# Patient Record
Sex: Female | Born: 1950 | Race: White | Hispanic: No
Health system: Southern US, Community
[De-identification: ages and names within clinical notes are randomized; demographics above are authoritative.]

---

## 1997-09-24 ENCOUNTER — Other Ambulatory Visit: Admission: RE | Admit: 1997-09-24 | Discharge: 1997-09-24 | Payer: Self-pay | Admitting: Obstetrics and Gynecology

## 1998-09-28 ENCOUNTER — Other Ambulatory Visit: Admission: RE | Admit: 1998-09-28 | Discharge: 1998-09-28 | Payer: Self-pay | Admitting: Obstetrics and Gynecology

## 2000-01-09 ENCOUNTER — Other Ambulatory Visit: Admission: RE | Admit: 2000-01-09 | Discharge: 2000-01-09 | Payer: Self-pay | Admitting: Obstetrics and Gynecology

## 2001-03-04 ENCOUNTER — Other Ambulatory Visit: Admission: RE | Admit: 2001-03-04 | Discharge: 2001-03-04 | Payer: Self-pay | Admitting: Obstetrics and Gynecology

## 2002-04-20 ENCOUNTER — Other Ambulatory Visit: Admission: RE | Admit: 2002-04-20 | Discharge: 2002-04-20 | Payer: Self-pay | Admitting: Obstetrics and Gynecology

## 2003-08-05 ENCOUNTER — Other Ambulatory Visit: Admission: RE | Admit: 2003-08-05 | Discharge: 2003-08-05 | Payer: Self-pay | Admitting: Obstetrics and Gynecology

## 2008-07-23 ENCOUNTER — Ambulatory Visit: Payer: Self-pay | Admitting: Family Medicine

## 2008-07-23 DIAGNOSIS — M5020 Other cervical disc displacement, unspecified cervical region: Secondary | ICD-10-CM | POA: Insufficient documentation

## 2008-07-23 DIAGNOSIS — F17200 Nicotine dependence, unspecified, uncomplicated: Secondary | ICD-10-CM | POA: Insufficient documentation

## 2008-07-23 DIAGNOSIS — J309 Allergic rhinitis, unspecified: Secondary | ICD-10-CM | POA: Insufficient documentation

## 2008-07-26 ENCOUNTER — Ambulatory Visit: Payer: Self-pay | Admitting: Family Medicine

## 2008-07-26 IMAGING — CR DG CERVICAL SPINE WITH FLEX & EXTEND
7 series · 7 of 7 positions shown · non-contrast
Comparison: None available.

CLINICAL DATA: Neck stiffness and discomfort.  Radiculopathy.
Right-sided neck pain times 3 months.

CERVICAL SPINE COMPLETE WITH FLEXION AND EXTENSION VIEWS

[view not recorded (1 of 7)]
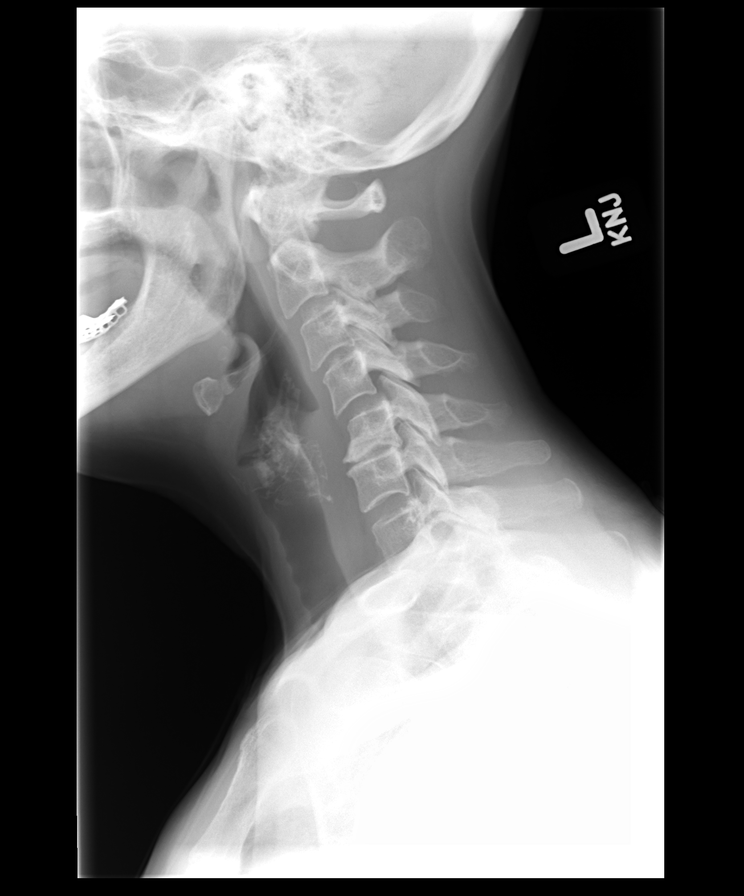

[view not recorded (2 of 7)]
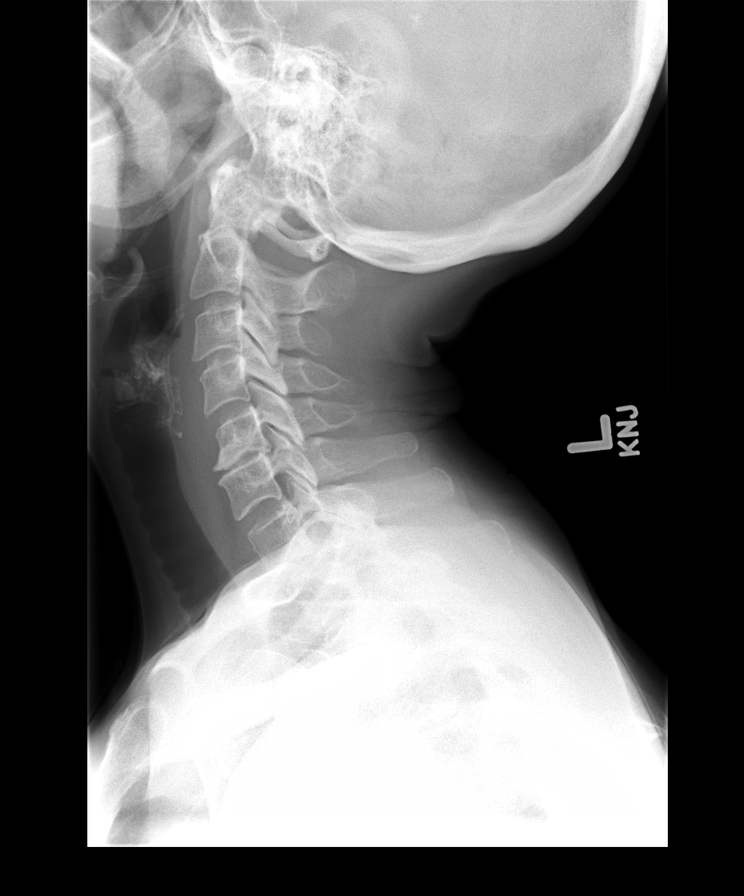

[view not recorded (3 of 7)]
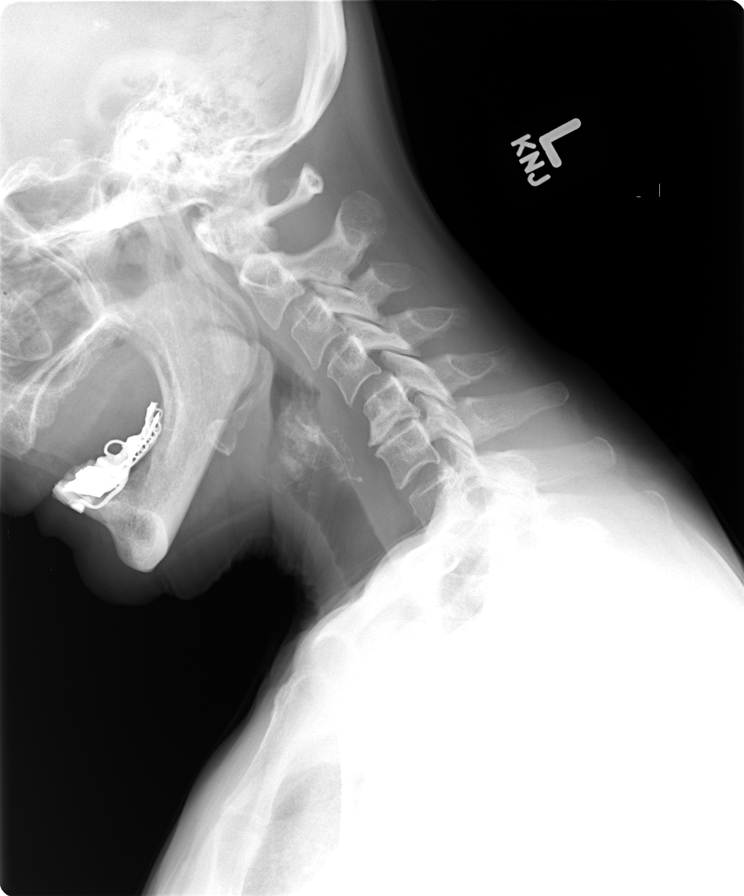

[view not recorded (4 of 7)]
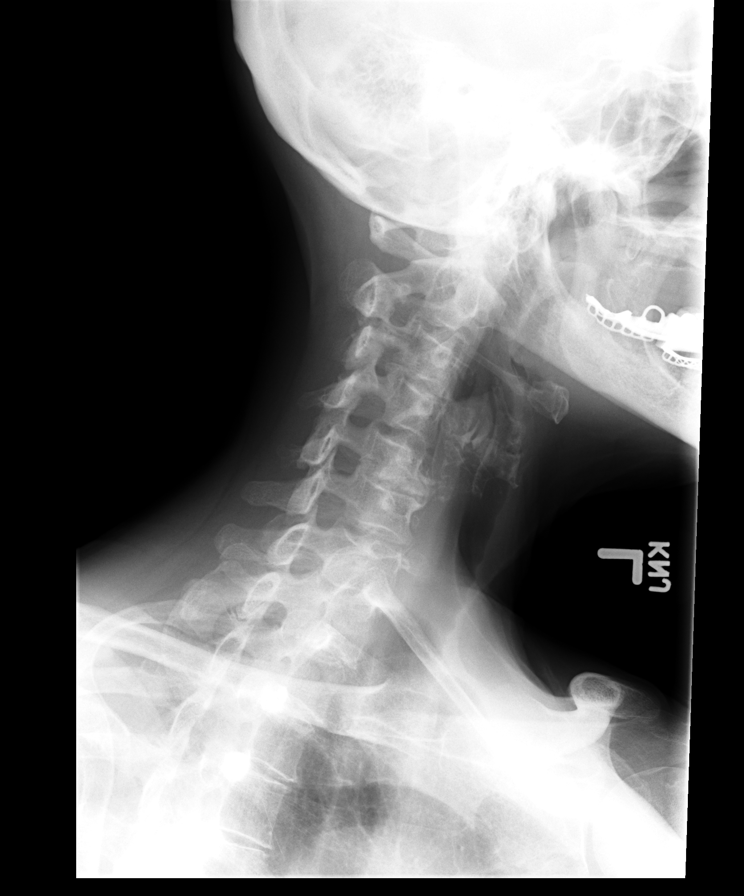

[view not recorded (5 of 7)]
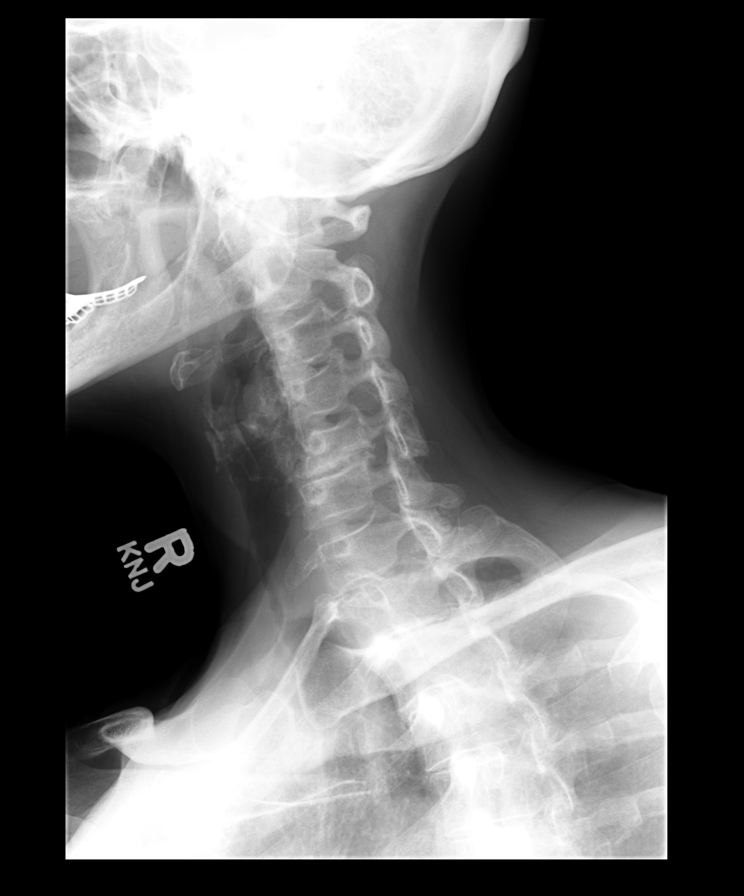

[view not recorded (6 of 7)]
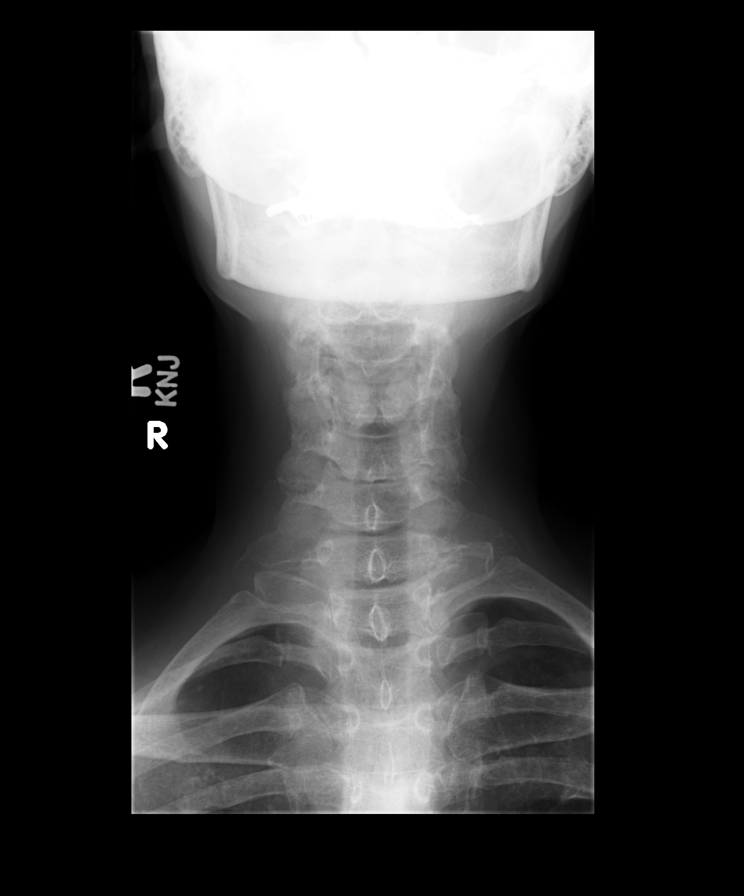

[view not recorded (7 of 7)]
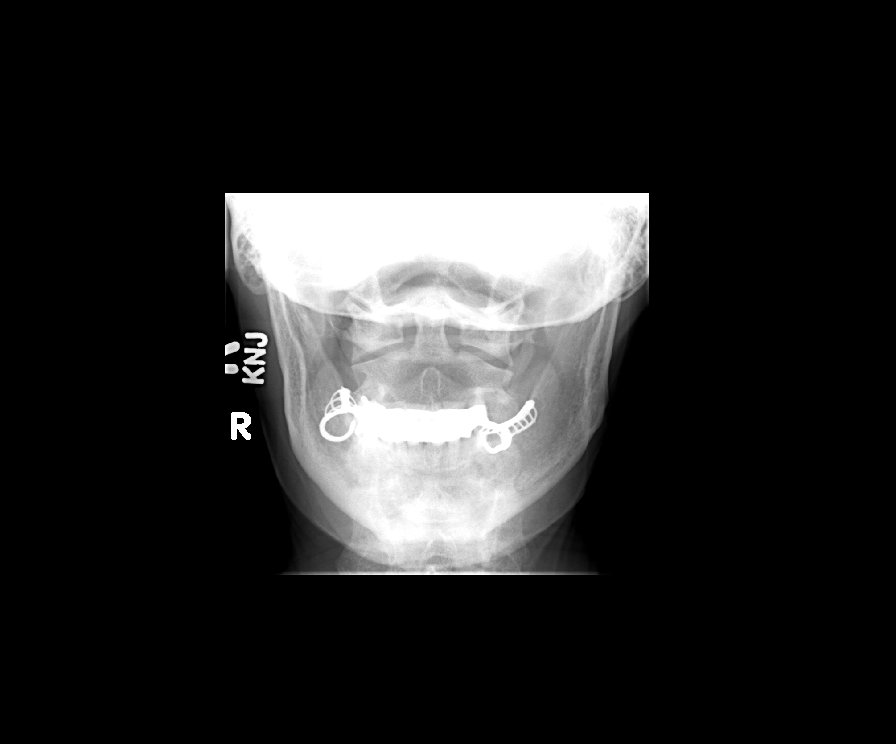

[7 of 7 positions shown; findings below may reference images not displayed]

FINDINGS: The prevertebral soft tissues are within normal limits.
There is straightening and some reversal of the normal cervical
lordosis in the neutral position.  There is loss of disc height
with slight retrolisthesis at C5-6.  Slight anterolisthesis is
noted at C4-5 and neutral position.  This does not change
significantly with flexion but does reduce in extension.
Degenerative facet changes are evident at C3-4.  Alignment is
otherwise anatomic.  Uncovertebral disease results in osseous
foraminal narrowing on the right at C3-4.  There is osseous
foraminal narrowing on the left C5-6.  Uncovertebral disease is
evident on the left at C5-6 on the AP view.
IMPRESSION: 1.  Grade 1 anterolisthesis of 2-3 mm at C4-5 reduces with
extension.
2.  Degenerative disc disease and loss of height at C5-6 with left
greater than right uncovertebral spurring.
3.  Osseous foraminal narrowing on the right at C3-4.

## 2008-08-12 ENCOUNTER — Ambulatory Visit: Payer: Self-pay | Admitting: Family Medicine

## 2008-08-12 DIAGNOSIS — R030 Elevated blood-pressure reading, without diagnosis of hypertension: Secondary | ICD-10-CM | POA: Insufficient documentation

## 2008-08-23 ENCOUNTER — Encounter: Payer: Self-pay | Admitting: Family Medicine

## 2008-09-02 ENCOUNTER — Ambulatory Visit: Payer: Self-pay | Admitting: Family Medicine

## 2010-01-18 ENCOUNTER — Encounter (HOSPITAL_COMMUNITY): Admission: RE | Admit: 2010-01-18 | Discharge: 2010-02-14 | Payer: Self-pay | Admitting: Endocrinology

## 2010-06-27 NOTE — Consult Note (Signed)
Summary: [facility]  [facility]   Imported By: Maryln Gottron 06/15/2009 10:01:54  _____________________________________________________________________  External Attachment:    Type:   Image     Comment:   External Document

## 2015-03-01 DIAGNOSIS — Z23 Encounter for immunization: Secondary | ICD-10-CM | POA: Diagnosis not present

## 2015-11-15 DIAGNOSIS — R87610 Atypical squamous cells of undetermined significance on cytologic smear of cervix (ASC-US): Secondary | ICD-10-CM | POA: Diagnosis not present

## 2015-11-15 DIAGNOSIS — Z01419 Encounter for gynecological examination (general) (routine) without abnormal findings: Secondary | ICD-10-CM | POA: Diagnosis not present

## 2015-11-15 DIAGNOSIS — Z1231 Encounter for screening mammogram for malignant neoplasm of breast: Secondary | ICD-10-CM | POA: Diagnosis not present

## 2015-11-15 DIAGNOSIS — R875 Abnormal microbiological findings in specimens from female genital organs: Secondary | ICD-10-CM | POA: Diagnosis not present

## 2015-11-18 DIAGNOSIS — N6489 Other specified disorders of breast: Secondary | ICD-10-CM | POA: Diagnosis not present

## 2016-03-10 DIAGNOSIS — Z23 Encounter for immunization: Secondary | ICD-10-CM | POA: Diagnosis not present

## 2016-10-30 DIAGNOSIS — I10 Essential (primary) hypertension: Secondary | ICD-10-CM | POA: Diagnosis not present

## 2016-10-30 DIAGNOSIS — E559 Vitamin D deficiency, unspecified: Secondary | ICD-10-CM | POA: Diagnosis not present

## 2016-11-06 DIAGNOSIS — M1811 Unilateral primary osteoarthritis of first carpometacarpal joint, right hand: Secondary | ICD-10-CM | POA: Diagnosis not present

## 2016-11-06 DIAGNOSIS — I10 Essential (primary) hypertension: Secondary | ICD-10-CM | POA: Diagnosis not present

## 2016-11-06 DIAGNOSIS — Z0001 Encounter for general adult medical examination with abnormal findings: Secondary | ICD-10-CM | POA: Diagnosis not present

## 2016-11-23 DIAGNOSIS — Z1231 Encounter for screening mammogram for malignant neoplasm of breast: Secondary | ICD-10-CM | POA: Diagnosis not present

## 2016-11-23 DIAGNOSIS — M8589 Other specified disorders of bone density and structure, multiple sites: Secondary | ICD-10-CM | POA: Diagnosis not present

## 2016-12-04 DIAGNOSIS — Z01419 Encounter for gynecological examination (general) (routine) without abnormal findings: Secondary | ICD-10-CM | POA: Diagnosis not present

## 2016-12-13 DIAGNOSIS — I10 Essential (primary) hypertension: Secondary | ICD-10-CM | POA: Diagnosis not present

## 2017-03-05 DIAGNOSIS — Z23 Encounter for immunization: Secondary | ICD-10-CM | POA: Diagnosis not present

## 2017-03-11 DIAGNOSIS — H2513 Age-related nuclear cataract, bilateral: Secondary | ICD-10-CM | POA: Diagnosis not present

## 2017-03-11 DIAGNOSIS — Z0101 Encounter for examination of eyes and vision with abnormal findings: Secondary | ICD-10-CM | POA: Diagnosis not present

## 2017-03-11 DIAGNOSIS — H524 Presbyopia: Secondary | ICD-10-CM | POA: Diagnosis not present

## 2017-03-18 DIAGNOSIS — R358 Other polyuria: Secondary | ICD-10-CM | POA: Diagnosis not present

## 2017-03-18 DIAGNOSIS — N390 Urinary tract infection, site not specified: Secondary | ICD-10-CM | POA: Diagnosis not present

## 2017-09-30 DIAGNOSIS — J45909 Unspecified asthma, uncomplicated: Secondary | ICD-10-CM | POA: Diagnosis not present

## 2017-12-23 DIAGNOSIS — Z1231 Encounter for screening mammogram for malignant neoplasm of breast: Secondary | ICD-10-CM | POA: Diagnosis not present

## 2018-01-31 DIAGNOSIS — Z01419 Encounter for gynecological examination (general) (routine) without abnormal findings: Secondary | ICD-10-CM | POA: Diagnosis not present

## 2018-03-07 DIAGNOSIS — Z23 Encounter for immunization: Secondary | ICD-10-CM | POA: Diagnosis not present

## 2018-12-29 DIAGNOSIS — Z1231 Encounter for screening mammogram for malignant neoplasm of breast: Secondary | ICD-10-CM | POA: Diagnosis not present

## 2019-02-13 DIAGNOSIS — R351 Nocturia: Secondary | ICD-10-CM | POA: Diagnosis not present

## 2019-02-13 DIAGNOSIS — B351 Tinea unguium: Secondary | ICD-10-CM | POA: Diagnosis not present

## 2019-02-13 DIAGNOSIS — Z1211 Encounter for screening for malignant neoplasm of colon: Secondary | ICD-10-CM | POA: Diagnosis not present

## 2019-02-13 DIAGNOSIS — R69 Illness, unspecified: Secondary | ICD-10-CM | POA: Diagnosis not present

## 2019-02-16 DIAGNOSIS — B351 Tinea unguium: Secondary | ICD-10-CM | POA: Diagnosis not present

## 2019-03-06 DIAGNOSIS — Z23 Encounter for immunization: Secondary | ICD-10-CM | POA: Diagnosis not present

## 2019-03-17 DIAGNOSIS — Z01419 Encounter for gynecological examination (general) (routine) without abnormal findings: Secondary | ICD-10-CM | POA: Diagnosis not present

## 2019-03-17 DIAGNOSIS — Z0000 Encounter for general adult medical examination without abnormal findings: Secondary | ICD-10-CM | POA: Diagnosis not present

## 2019-06-16 DIAGNOSIS — M85851 Other specified disorders of bone density and structure, right thigh: Secondary | ICD-10-CM | POA: Diagnosis not present

## 2019-06-16 DIAGNOSIS — M85852 Other specified disorders of bone density and structure, left thigh: Secondary | ICD-10-CM | POA: Diagnosis not present

## 2019-06-16 DIAGNOSIS — Z780 Asymptomatic menopausal state: Secondary | ICD-10-CM | POA: Diagnosis not present

## 2019-06-24 DIAGNOSIS — D649 Anemia, unspecified: Secondary | ICD-10-CM | POA: Diagnosis not present

## 2019-06-24 DIAGNOSIS — I10 Essential (primary) hypertension: Secondary | ICD-10-CM | POA: Diagnosis not present

## 2019-06-24 DIAGNOSIS — D509 Iron deficiency anemia, unspecified: Secondary | ICD-10-CM | POA: Diagnosis not present

## 2019-06-26 ENCOUNTER — Ambulatory Visit: Payer: Self-pay

## 2019-07-01 DIAGNOSIS — M8580 Other specified disorders of bone density and structure, unspecified site: Secondary | ICD-10-CM | POA: Diagnosis not present

## 2019-07-01 DIAGNOSIS — R351 Nocturia: Secondary | ICD-10-CM | POA: Diagnosis not present

## 2019-07-01 DIAGNOSIS — I10 Essential (primary) hypertension: Secondary | ICD-10-CM | POA: Diagnosis not present

## 2019-07-01 DIAGNOSIS — D508 Other iron deficiency anemias: Secondary | ICD-10-CM | POA: Diagnosis not present

## 2019-07-01 DIAGNOSIS — Z0000 Encounter for general adult medical examination without abnormal findings: Secondary | ICD-10-CM | POA: Diagnosis not present

## 2019-07-01 DIAGNOSIS — J45909 Unspecified asthma, uncomplicated: Secondary | ICD-10-CM | POA: Diagnosis not present

## 2019-07-01 DIAGNOSIS — Z8639 Personal history of other endocrine, nutritional and metabolic disease: Secondary | ICD-10-CM | POA: Diagnosis not present

## 2019-07-01 DIAGNOSIS — R69 Illness, unspecified: Secondary | ICD-10-CM | POA: Diagnosis not present

## 2019-07-01 DIAGNOSIS — I7300 Raynaud's syndrome without gangrene: Secondary | ICD-10-CM | POA: Diagnosis not present

## 2019-07-01 DIAGNOSIS — B351 Tinea unguium: Secondary | ICD-10-CM | POA: Diagnosis not present

## 2019-07-04 ENCOUNTER — Ambulatory Visit: Payer: Medicare HMO | Attending: Internal Medicine

## 2019-07-04 DIAGNOSIS — Z23 Encounter for immunization: Secondary | ICD-10-CM | POA: Insufficient documentation

## 2019-07-04 NOTE — Progress Notes (Signed)
   Covid-19 Vaccination Clinic  Name:  Linda Cuevas    MRN: [id number] DOB: 1950-12-18  07/04/2019  [name] was observed post Covid-19 immunization for 15 minutes without incidence. She was provided with Vaccine Information Sheet and instruction to access the V-Safe system.   [name] was instructed to call 911 with any severe reactions post vaccine: Marland Kitchen Difficulty breathing  . Swelling of your face and throat  . A fast heartbeat  . A bad rash all over your body  . Dizziness and weakness    Immunizations Administered    Name Date Dose VIS Date Route   Pfizer COVID-19 Vaccine 07/04/2019  4:41 PM 0.3 mL 05/08/2019 Intramuscular   Manufacturer: [place]   Lot: [id number]   [place]: [id number]

## 2019-07-07 ENCOUNTER — Ambulatory Visit: Payer: Self-pay

## 2019-07-29 ENCOUNTER — Ambulatory Visit: Payer: Medicare HMO | Attending: Internal Medicine

## 2019-07-29 DIAGNOSIS — Z23 Encounter for immunization: Secondary | ICD-10-CM | POA: Insufficient documentation

## 2019-07-29 NOTE — Progress Notes (Signed)
   Covid-19 Vaccination Clinic  Name:  Linda Cuevas    MRN: [id number] DOB: Feb 05, 1951  07/29/2019  [name] was observed post Covid-19 immunization for 15 minutes without incident. She was provided with Vaccine Information Sheet and instruction to access the V-Safe system.   [name] was instructed to call 911 with any severe reactions post vaccine: Marland Kitchen Difficulty breathing  . Swelling of face and throat  . A fast heartbeat  . A bad rash all over body  . Dizziness and weakness   Immunizations Administered    Name Date Dose VIS Date Route   Pfizer COVID-19 Vaccine 07/29/2019 12:51 PM 0.3 mL 05/08/2019 Intramuscular   Manufacturer: [place]   Lot: [id number]   [place]: [id number]

## 2019-08-12 DIAGNOSIS — Z0000 Encounter for general adult medical examination without abnormal findings: Secondary | ICD-10-CM | POA: Diagnosis not present

## 2019-08-12 DIAGNOSIS — E559 Vitamin D deficiency, unspecified: Secondary | ICD-10-CM | POA: Diagnosis not present

## 2019-08-12 DIAGNOSIS — Z79899 Other long term (current) drug therapy: Secondary | ICD-10-CM | POA: Diagnosis not present

## 2019-08-12 DIAGNOSIS — I10 Essential (primary) hypertension: Secondary | ICD-10-CM | POA: Diagnosis not present

## 2019-10-06 DIAGNOSIS — Z0000 Encounter for general adult medical examination without abnormal findings: Secondary | ICD-10-CM | POA: Diagnosis not present

## 2019-10-06 DIAGNOSIS — E559 Vitamin D deficiency, unspecified: Secondary | ICD-10-CM | POA: Diagnosis not present

## 2019-10-13 DIAGNOSIS — E611 Iron deficiency: Secondary | ICD-10-CM | POA: Diagnosis not present

## 2019-10-19 DIAGNOSIS — M21612 Bunion of left foot: Secondary | ICD-10-CM | POA: Diagnosis not present

## 2019-10-19 DIAGNOSIS — M79671 Pain in right foot: Secondary | ICD-10-CM | POA: Diagnosis not present

## 2019-10-19 DIAGNOSIS — M21611 Bunion of right foot: Secondary | ICD-10-CM | POA: Diagnosis not present

## 2019-10-19 DIAGNOSIS — M79672 Pain in left foot: Secondary | ICD-10-CM | POA: Diagnosis not present

## 2019-12-28 DIAGNOSIS — M25572 Pain in left ankle and joints of left foot: Secondary | ICD-10-CM | POA: Diagnosis not present

## 2019-12-28 DIAGNOSIS — M21612 Bunion of left foot: Secondary | ICD-10-CM | POA: Diagnosis not present

## 2019-12-28 DIAGNOSIS — M2042 Other hammer toe(s) (acquired), left foot: Secondary | ICD-10-CM | POA: Diagnosis not present

## 2019-12-28 DIAGNOSIS — Z01812 Encounter for preprocedural laboratory examination: Secondary | ICD-10-CM | POA: Diagnosis not present

## 2020-01-04 DIAGNOSIS — Z1231 Encounter for screening mammogram for malignant neoplasm of breast: Secondary | ICD-10-CM | POA: Diagnosis not present

## 2020-01-05 DIAGNOSIS — D508 Other iron deficiency anemias: Secondary | ICD-10-CM | POA: Diagnosis not present

## 2020-01-05 DIAGNOSIS — M2042 Other hammer toe(s) (acquired), left foot: Secondary | ICD-10-CM | POA: Diagnosis not present

## 2020-01-12 DIAGNOSIS — I10 Essential (primary) hypertension: Secondary | ICD-10-CM | POA: Diagnosis not present

## 2020-01-12 DIAGNOSIS — M2042 Other hammer toe(s) (acquired), left foot: Secondary | ICD-10-CM | POA: Diagnosis not present

## 2020-01-12 DIAGNOSIS — M25572 Pain in left ankle and joints of left foot: Secondary | ICD-10-CM | POA: Diagnosis not present

## 2020-01-12 DIAGNOSIS — M2012 Hallux valgus (acquired), left foot: Secondary | ICD-10-CM | POA: Diagnosis not present

## 2020-01-12 DIAGNOSIS — M21612 Bunion of left foot: Secondary | ICD-10-CM | POA: Diagnosis not present

## 2020-01-15 DIAGNOSIS — M12272 Villonodular synovitis (pigmented), left ankle and foot: Secondary | ICD-10-CM | POA: Diagnosis not present

## 2020-01-27 DIAGNOSIS — M12272 Villonodular synovitis (pigmented), left ankle and foot: Secondary | ICD-10-CM | POA: Diagnosis not present

## 2020-02-11 DIAGNOSIS — M12272 Villonodular synovitis (pigmented), left ankle and foot: Secondary | ICD-10-CM | POA: Diagnosis not present

## 2020-03-01 DIAGNOSIS — R69 Illness, unspecified: Secondary | ICD-10-CM | POA: Diagnosis not present

## 2020-03-14 DIAGNOSIS — M12272 Villonodular synovitis (pigmented), left ankle and foot: Secondary | ICD-10-CM | POA: Diagnosis not present

## 2020-05-09 DIAGNOSIS — Z79890 Hormone replacement therapy (postmenopausal): Secondary | ICD-10-CM | POA: Diagnosis not present

## 2020-05-09 DIAGNOSIS — Z124 Encounter for screening for malignant neoplasm of cervix: Secondary | ICD-10-CM | POA: Diagnosis not present

## 2020-05-09 DIAGNOSIS — Z01411 Encounter for gynecological examination (general) (routine) with abnormal findings: Secondary | ICD-10-CM | POA: Diagnosis not present

## 2020-05-09 DIAGNOSIS — Z01419 Encounter for gynecological examination (general) (routine) without abnormal findings: Secondary | ICD-10-CM | POA: Diagnosis not present

## 2020-05-09 DIAGNOSIS — M810 Age-related osteoporosis without current pathological fracture: Secondary | ICD-10-CM | POA: Diagnosis not present

## 2020-05-09 DIAGNOSIS — Z6821 Body mass index (BMI) 21.0-21.9, adult: Secondary | ICD-10-CM | POA: Diagnosis not present

## 2020-05-09 DIAGNOSIS — N959 Unspecified menopausal and perimenopausal disorder: Secondary | ICD-10-CM | POA: Diagnosis not present

## 2020-06-19 DIAGNOSIS — Z03818 Encounter for observation for suspected exposure to other biological agents ruled out: Secondary | ICD-10-CM | POA: Diagnosis not present

## 2020-06-19 DIAGNOSIS — Z20822 Contact with and (suspected) exposure to covid-19: Secondary | ICD-10-CM | POA: Diagnosis not present

## 2020-06-19 DIAGNOSIS — H66002 Acute suppurative otitis media without spontaneous rupture of ear drum, left ear: Secondary | ICD-10-CM | POA: Diagnosis not present

## 2020-06-29 DIAGNOSIS — I10 Essential (primary) hypertension: Secondary | ICD-10-CM | POA: Diagnosis not present

## 2020-07-06 DIAGNOSIS — Z23 Encounter for immunization: Secondary | ICD-10-CM | POA: Diagnosis not present

## 2020-07-06 DIAGNOSIS — Z1212 Encounter for screening for malignant neoplasm of rectum: Secondary | ICD-10-CM | POA: Diagnosis not present

## 2020-07-06 DIAGNOSIS — Z0001 Encounter for general adult medical examination with abnormal findings: Secondary | ICD-10-CM | POA: Diagnosis not present

## 2020-07-06 DIAGNOSIS — Z0000 Encounter for general adult medical examination without abnormal findings: Secondary | ICD-10-CM | POA: Diagnosis not present

## 2020-07-06 DIAGNOSIS — I7300 Raynaud's syndrome without gangrene: Secondary | ICD-10-CM | POA: Diagnosis not present

## 2020-07-06 DIAGNOSIS — Z1211 Encounter for screening for malignant neoplasm of colon: Secondary | ICD-10-CM | POA: Diagnosis not present

## 2020-07-06 DIAGNOSIS — Z8639 Personal history of other endocrine, nutritional and metabolic disease: Secondary | ICD-10-CM | POA: Diagnosis not present

## 2020-07-06 DIAGNOSIS — I10 Essential (primary) hypertension: Secondary | ICD-10-CM | POA: Diagnosis not present

## 2020-07-06 DIAGNOSIS — E559 Vitamin D deficiency, unspecified: Secondary | ICD-10-CM | POA: Diagnosis not present

## 2020-07-06 DIAGNOSIS — M8580 Other specified disorders of bone density and structure, unspecified site: Secondary | ICD-10-CM | POA: Diagnosis not present

## 2020-07-19 DIAGNOSIS — I7300 Raynaud's syndrome without gangrene: Secondary | ICD-10-CM | POA: Diagnosis not present

## 2020-07-19 DIAGNOSIS — I10 Essential (primary) hypertension: Secondary | ICD-10-CM | POA: Diagnosis not present

## 2020-07-19 DIAGNOSIS — R195 Other fecal abnormalities: Secondary | ICD-10-CM | POA: Diagnosis not present

## 2020-08-02 DIAGNOSIS — R195 Other fecal abnormalities: Secondary | ICD-10-CM | POA: Diagnosis not present

## 2020-08-09 DIAGNOSIS — Z01818 Encounter for other preprocedural examination: Secondary | ICD-10-CM | POA: Diagnosis not present

## 2020-08-09 DIAGNOSIS — R195 Other fecal abnormalities: Secondary | ICD-10-CM | POA: Diagnosis not present

## 2020-08-09 DIAGNOSIS — K635 Polyp of colon: Secondary | ICD-10-CM | POA: Diagnosis not present

## 2020-08-09 DIAGNOSIS — Z1211 Encounter for screening for malignant neoplasm of colon: Secondary | ICD-10-CM | POA: Diagnosis not present

## 2020-08-16 DIAGNOSIS — K635 Polyp of colon: Secondary | ICD-10-CM | POA: Diagnosis not present

## 2020-09-13 DIAGNOSIS — D126 Benign neoplasm of colon, unspecified: Secondary | ICD-10-CM | POA: Diagnosis not present

## 2020-09-13 DIAGNOSIS — K5520 Angiodysplasia of colon without hemorrhage: Secondary | ICD-10-CM | POA: Diagnosis not present

## 2020-09-13 DIAGNOSIS — K648 Other hemorrhoids: Secondary | ICD-10-CM | POA: Diagnosis not present

## 2021-01-13 DIAGNOSIS — Z1231 Encounter for screening mammogram for malignant neoplasm of breast: Secondary | ICD-10-CM | POA: Diagnosis not present

## 2021-06-21 DIAGNOSIS — Z01419 Encounter for gynecological examination (general) (routine) without abnormal findings: Secondary | ICD-10-CM | POA: Diagnosis not present

## 2021-06-21 DIAGNOSIS — M810 Age-related osteoporosis without current pathological fracture: Secondary | ICD-10-CM | POA: Diagnosis not present

## 2021-06-21 DIAGNOSIS — Z124 Encounter for screening for malignant neoplasm of cervix: Secondary | ICD-10-CM | POA: Diagnosis not present

## 2021-06-21 DIAGNOSIS — N959 Unspecified menopausal and perimenopausal disorder: Secondary | ICD-10-CM | POA: Diagnosis not present

## 2021-06-21 DIAGNOSIS — Z6821 Body mass index (BMI) 21.0-21.9, adult: Secondary | ICD-10-CM | POA: Diagnosis not present

## 2021-06-21 DIAGNOSIS — Z01411 Encounter for gynecological examination (general) (routine) with abnormal findings: Secondary | ICD-10-CM | POA: Diagnosis not present

## 2021-07-05 DIAGNOSIS — M85852 Other specified disorders of bone density and structure, left thigh: Secondary | ICD-10-CM | POA: Diagnosis not present

## 2021-07-05 DIAGNOSIS — Z780 Asymptomatic menopausal state: Secondary | ICD-10-CM | POA: Diagnosis not present

## 2021-07-05 DIAGNOSIS — M85851 Other specified disorders of bone density and structure, right thigh: Secondary | ICD-10-CM | POA: Diagnosis not present

## 2021-07-06 DIAGNOSIS — E559 Vitamin D deficiency, unspecified: Secondary | ICD-10-CM | POA: Diagnosis not present

## 2021-07-06 DIAGNOSIS — I10 Essential (primary) hypertension: Secondary | ICD-10-CM | POA: Diagnosis not present

## 2021-07-06 DIAGNOSIS — D508 Other iron deficiency anemias: Secondary | ICD-10-CM | POA: Diagnosis not present

## 2021-07-11 DIAGNOSIS — R69 Illness, unspecified: Secondary | ICD-10-CM | POA: Diagnosis not present

## 2021-07-11 DIAGNOSIS — M8580 Other specified disorders of bone density and structure, unspecified site: Secondary | ICD-10-CM | POA: Diagnosis not present

## 2021-07-11 DIAGNOSIS — I10 Essential (primary) hypertension: Secondary | ICD-10-CM | POA: Diagnosis not present

## 2021-07-11 DIAGNOSIS — Z0000 Encounter for general adult medical examination without abnormal findings: Secondary | ICD-10-CM | POA: Diagnosis not present

## 2021-07-11 DIAGNOSIS — D508 Other iron deficiency anemias: Secondary | ICD-10-CM | POA: Diagnosis not present

## 2021-07-11 DIAGNOSIS — Z8639 Personal history of other endocrine, nutritional and metabolic disease: Secondary | ICD-10-CM | POA: Diagnosis not present

## 2021-07-11 DIAGNOSIS — I7300 Raynaud's syndrome without gangrene: Secondary | ICD-10-CM | POA: Diagnosis not present

## 2021-11-02 DIAGNOSIS — R300 Dysuria: Secondary | ICD-10-CM | POA: Diagnosis not present

## 2021-12-27 DIAGNOSIS — H2513 Age-related nuclear cataract, bilateral: Secondary | ICD-10-CM | POA: Diagnosis not present

## 2021-12-27 DIAGNOSIS — Z0100 Encounter for examination of eyes and vision without abnormal findings: Secondary | ICD-10-CM | POA: Diagnosis not present

## 2021-12-27 DIAGNOSIS — H5112 Convergence excess: Secondary | ICD-10-CM | POA: Diagnosis not present

## 2022-01-26 DIAGNOSIS — Z1231 Encounter for screening mammogram for malignant neoplasm of breast: Secondary | ICD-10-CM | POA: Diagnosis not present

## 2022-07-12 DIAGNOSIS — I10 Essential (primary) hypertension: Secondary | ICD-10-CM | POA: Diagnosis not present

## 2022-07-17 DIAGNOSIS — R69 Illness, unspecified: Secondary | ICD-10-CM | POA: Diagnosis not present

## 2022-07-17 DIAGNOSIS — M8580 Other specified disorders of bone density and structure, unspecified site: Secondary | ICD-10-CM | POA: Diagnosis not present

## 2022-07-17 DIAGNOSIS — N1832 Chronic kidney disease, stage 3b: Secondary | ICD-10-CM | POA: Diagnosis not present

## 2022-07-17 DIAGNOSIS — I10 Essential (primary) hypertension: Secondary | ICD-10-CM | POA: Diagnosis not present

## 2022-07-17 DIAGNOSIS — D508 Other iron deficiency anemias: Secondary | ICD-10-CM | POA: Diagnosis not present

## 2022-07-17 DIAGNOSIS — Z0000 Encounter for general adult medical examination without abnormal findings: Secondary | ICD-10-CM | POA: Diagnosis not present

## 2022-07-17 DIAGNOSIS — K219 Gastro-esophageal reflux disease without esophagitis: Secondary | ICD-10-CM | POA: Diagnosis not present

## 2022-07-31 DIAGNOSIS — Z01419 Encounter for gynecological examination (general) (routine) without abnormal findings: Secondary | ICD-10-CM | POA: Diagnosis not present

## 2022-07-31 DIAGNOSIS — Z6821 Body mass index (BMI) 21.0-21.9, adult: Secondary | ICD-10-CM | POA: Diagnosis not present

## 2022-07-31 DIAGNOSIS — M810 Age-related osteoporosis without current pathological fracture: Secondary | ICD-10-CM | POA: Diagnosis not present

## 2022-07-31 DIAGNOSIS — E559 Vitamin D deficiency, unspecified: Secondary | ICD-10-CM | POA: Diagnosis not present

## 2022-07-31 DIAGNOSIS — Z0000 Encounter for general adult medical examination without abnormal findings: Secondary | ICD-10-CM | POA: Diagnosis not present

## 2022-07-31 DIAGNOSIS — I10 Essential (primary) hypertension: Secondary | ICD-10-CM | POA: Diagnosis not present

## 2022-07-31 DIAGNOSIS — Z124 Encounter for screening for malignant neoplasm of cervix: Secondary | ICD-10-CM | POA: Diagnosis not present

## 2022-07-31 DIAGNOSIS — Z01411 Encounter for gynecological examination (general) (routine) with abnormal findings: Secondary | ICD-10-CM | POA: Diagnosis not present

## 2022-07-31 DIAGNOSIS — N959 Unspecified menopausal and perimenopausal disorder: Secondary | ICD-10-CM | POA: Diagnosis not present

## 2022-09-20 ENCOUNTER — Other Ambulatory Visit: Payer: Self-pay | Admitting: Nephrology

## 2022-09-20 DIAGNOSIS — E559 Vitamin D deficiency, unspecified: Secondary | ICD-10-CM | POA: Diagnosis not present

## 2022-09-20 DIAGNOSIS — D631 Anemia in chronic kidney disease: Secondary | ICD-10-CM | POA: Diagnosis not present

## 2022-09-20 DIAGNOSIS — I129 Hypertensive chronic kidney disease with stage 1 through stage 4 chronic kidney disease, or unspecified chronic kidney disease: Secondary | ICD-10-CM

## 2022-09-20 DIAGNOSIS — N1832 Chronic kidney disease, stage 3b: Secondary | ICD-10-CM | POA: Diagnosis not present

## 2022-09-20 DIAGNOSIS — N189 Chronic kidney disease, unspecified: Secondary | ICD-10-CM

## 2022-10-12 ENCOUNTER — Ambulatory Visit
Admission: RE | Admit: 2022-10-12 | Discharge: 2022-10-12 | Disposition: A | Discharge status: Home / Self Care | Payer: Medicare HMO | Source: Ambulatory Visit | Attending: Nephrology | Admitting: Nephrology

## 2022-10-12 DIAGNOSIS — E559 Vitamin D deficiency, unspecified: Secondary | ICD-10-CM

## 2022-10-12 DIAGNOSIS — N1832 Chronic kidney disease, stage 3b: Secondary | ICD-10-CM

## 2022-10-12 DIAGNOSIS — N200 Calculus of kidney: Secondary | ICD-10-CM | POA: Diagnosis not present

## 2022-10-12 DIAGNOSIS — D631 Anemia in chronic kidney disease: Secondary | ICD-10-CM

## 2022-10-12 DIAGNOSIS — I129 Hypertensive chronic kidney disease with stage 1 through stage 4 chronic kidney disease, or unspecified chronic kidney disease: Secondary | ICD-10-CM

## 2022-10-15 DIAGNOSIS — D508 Other iron deficiency anemias: Secondary | ICD-10-CM | POA: Diagnosis not present

## 2022-11-20 DIAGNOSIS — I10 Essential (primary) hypertension: Secondary | ICD-10-CM | POA: Diagnosis not present

## 2023-01-30 DIAGNOSIS — Z1231 Encounter for screening mammogram for malignant neoplasm of breast: Secondary | ICD-10-CM | POA: Diagnosis not present

## 2023-03-26 DIAGNOSIS — N1832 Chronic kidney disease, stage 3b: Secondary | ICD-10-CM | POA: Diagnosis not present

## 2023-04-12 DIAGNOSIS — N200 Calculus of kidney: Secondary | ICD-10-CM | POA: Diagnosis not present

## 2023-04-12 DIAGNOSIS — E559 Vitamin D deficiency, unspecified: Secondary | ICD-10-CM | POA: Diagnosis not present

## 2023-04-12 DIAGNOSIS — N1832 Chronic kidney disease, stage 3b: Secondary | ICD-10-CM | POA: Diagnosis not present

## 2023-04-12 DIAGNOSIS — I129 Hypertensive chronic kidney disease with stage 1 through stage 4 chronic kidney disease, or unspecified chronic kidney disease: Secondary | ICD-10-CM | POA: Diagnosis not present

## 2023-04-12 DIAGNOSIS — D631 Anemia in chronic kidney disease: Secondary | ICD-10-CM | POA: Diagnosis not present

## 2023-05-01 DIAGNOSIS — Z6821 Body mass index (BMI) 21.0-21.9, adult: Secondary | ICD-10-CM | POA: Diagnosis not present

## 2023-05-01 DIAGNOSIS — J029 Acute pharyngitis, unspecified: Secondary | ICD-10-CM | POA: Diagnosis not present

## 2023-06-11 DIAGNOSIS — Z6821 Body mass index (BMI) 21.0-21.9, adult: Secondary | ICD-10-CM | POA: Diagnosis not present

## 2023-06-11 DIAGNOSIS — H66002 Acute suppurative otitis media without spontaneous rupture of ear drum, left ear: Secondary | ICD-10-CM | POA: Diagnosis not present

## 2023-06-14 DIAGNOSIS — H5112 Convergence excess: Secondary | ICD-10-CM | POA: Diagnosis not present

## 2023-06-14 DIAGNOSIS — Z0100 Encounter for examination of eyes and vision without abnormal findings: Secondary | ICD-10-CM | POA: Diagnosis not present

## 2023-06-14 DIAGNOSIS — H2513 Age-related nuclear cataract, bilateral: Secondary | ICD-10-CM | POA: Diagnosis not present

## 2023-06-14 DIAGNOSIS — H5022 Vertical strabismus, left eye: Secondary | ICD-10-CM | POA: Diagnosis not present

## 2023-06-14 DIAGNOSIS — H25043 Posterior subcapsular polar age-related cataract, bilateral: Secondary | ICD-10-CM | POA: Diagnosis not present

## 2023-06-14 DIAGNOSIS — H5000 Unspecified esotropia: Secondary | ICD-10-CM | POA: Diagnosis not present

## 2023-06-14 DIAGNOSIS — H18413 Arcus senilis, bilateral: Secondary | ICD-10-CM | POA: Diagnosis not present

## 2023-06-18 DIAGNOSIS — R0982 Postnasal drip: Secondary | ICD-10-CM | POA: Diagnosis not present

## 2023-06-18 DIAGNOSIS — H938X2 Other specified disorders of left ear: Secondary | ICD-10-CM | POA: Diagnosis not present

## 2023-06-18 DIAGNOSIS — J029 Acute pharyngitis, unspecified: Secondary | ICD-10-CM | POA: Diagnosis not present

## 2023-06-25 DIAGNOSIS — H938X2 Other specified disorders of left ear: Secondary | ICD-10-CM | POA: Diagnosis not present

## 2023-06-25 DIAGNOSIS — H6992 Unspecified Eustachian tube disorder, left ear: Secondary | ICD-10-CM | POA: Diagnosis not present

## 2023-06-25 DIAGNOSIS — R0982 Postnasal drip: Secondary | ICD-10-CM | POA: Diagnosis not present

## 2023-07-16 DIAGNOSIS — D508 Other iron deficiency anemias: Secondary | ICD-10-CM | POA: Diagnosis not present

## 2023-07-16 DIAGNOSIS — I10 Essential (primary) hypertension: Secondary | ICD-10-CM | POA: Diagnosis not present

## 2023-07-22 DIAGNOSIS — Z8639 Personal history of other endocrine, nutritional and metabolic disease: Secondary | ICD-10-CM | POA: Diagnosis not present

## 2023-07-22 DIAGNOSIS — M810 Age-related osteoporosis without current pathological fracture: Secondary | ICD-10-CM | POA: Diagnosis not present

## 2023-07-22 DIAGNOSIS — F5104 Psychophysiologic insomnia: Secondary | ICD-10-CM | POA: Diagnosis not present

## 2023-07-22 DIAGNOSIS — I10 Essential (primary) hypertension: Secondary | ICD-10-CM | POA: Diagnosis not present

## 2023-07-22 DIAGNOSIS — K219 Gastro-esophageal reflux disease without esophagitis: Secondary | ICD-10-CM | POA: Diagnosis not present

## 2023-07-22 DIAGNOSIS — N1831 Chronic kidney disease, stage 3a: Secondary | ICD-10-CM | POA: Diagnosis not present

## 2023-07-22 DIAGNOSIS — Z0000 Encounter for general adult medical examination without abnormal findings: Secondary | ICD-10-CM | POA: Diagnosis not present

## 2023-09-17 DIAGNOSIS — M8588 Other specified disorders of bone density and structure, other site: Secondary | ICD-10-CM | POA: Diagnosis not present

## 2023-09-17 DIAGNOSIS — Z7952 Long term (current) use of systemic steroids: Secondary | ICD-10-CM | POA: Diagnosis not present

## 2023-09-17 DIAGNOSIS — M810 Age-related osteoporosis without current pathological fracture: Secondary | ICD-10-CM | POA: Diagnosis not present

## 2023-09-19 DIAGNOSIS — R8290 Unspecified abnormal findings in urine: Secondary | ICD-10-CM | POA: Diagnosis not present

## 2023-09-19 DIAGNOSIS — R300 Dysuria: Secondary | ICD-10-CM | POA: Diagnosis not present

## 2023-09-19 DIAGNOSIS — R399 Unspecified symptoms and signs involving the genitourinary system: Secondary | ICD-10-CM | POA: Diagnosis not present

## 2023-10-02 DIAGNOSIS — Z860100 Personal history of colon polyps, unspecified: Secondary | ICD-10-CM | POA: Diagnosis not present

## 2023-10-02 DIAGNOSIS — Z01818 Encounter for other preprocedural examination: Secondary | ICD-10-CM | POA: Diagnosis not present

## 2023-10-29 DIAGNOSIS — K635 Polyp of colon: Secondary | ICD-10-CM | POA: Diagnosis not present

## 2023-10-29 DIAGNOSIS — Z1211 Encounter for screening for malignant neoplasm of colon: Secondary | ICD-10-CM | POA: Diagnosis not present

## 2023-10-29 DIAGNOSIS — Z860100 Personal history of colon polyps, unspecified: Secondary | ICD-10-CM | POA: Diagnosis not present

## 2023-11-16 DIAGNOSIS — K635 Polyp of colon: Secondary | ICD-10-CM | POA: Diagnosis not present

## 2024-02-05 DIAGNOSIS — Z1231 Encounter for screening mammogram for malignant neoplasm of breast: Secondary | ICD-10-CM | POA: Diagnosis not present

## 2024-05-03 DIAGNOSIS — R82998 Other abnormal findings in urine: Secondary | ICD-10-CM | POA: Diagnosis not present

## 2024-05-03 DIAGNOSIS — R300 Dysuria: Secondary | ICD-10-CM | POA: Diagnosis not present
